# Patient Record
Sex: Male | Born: 1955 | Race: White | Hispanic: No | Marital: Married | Smoking: Never smoker
Health system: Southern US, Community
[De-identification: ages and names within clinical notes are randomized; demographics above are authoritative.]

## PROBLEM LIST (undated history)

## (undated) DIAGNOSIS — F419 Anxiety disorder, unspecified: Secondary | ICD-10-CM

## (undated) DIAGNOSIS — E7800 Pure hypercholesterolemia, unspecified: Secondary | ICD-10-CM

## (undated) DIAGNOSIS — H409 Unspecified glaucoma: Secondary | ICD-10-CM

## (undated) HISTORY — DX: Pure hypercholesterolemia, unspecified: E78.00

## (undated) HISTORY — DX: Anxiety disorder, unspecified: F41.9

## (undated) HISTORY — DX: Unspecified glaucoma: H40.9

---

## 1998-08-28 HISTORY — PX: ROTATOR CUFF REPAIR: SHX139

## 2006-10-30 ENCOUNTER — Encounter: Admission: RE | Admit: 2006-10-30 | Discharge: 2006-10-30 | Payer: Self-pay | Admitting: Orthopedic Surgery
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
## 2017-08-16 ENCOUNTER — Ambulatory Visit (INDEPENDENT_AMBULATORY_CARE_PROVIDER_SITE_OTHER): Payer: 59 | Admitting: Pediatrics

## 2017-08-16 ENCOUNTER — Encounter: Payer: Self-pay | Admitting: Pediatrics

## 2017-08-16 VITALS — BP 120/78 | HR 78 | Temp 97.7°F | Resp 16 | Ht 70.5 in | Wt 217.0 lb

## 2017-08-16 DIAGNOSIS — H1010 Acute atopic conjunctivitis, unspecified eye: Secondary | ICD-10-CM | POA: Diagnosis not present

## 2017-08-16 DIAGNOSIS — J3089 Other allergic rhinitis: Secondary | ICD-10-CM

## 2017-08-16 DIAGNOSIS — Z8669 Personal history of other diseases of the nervous system and sense organs: Secondary | ICD-10-CM | POA: Diagnosis not present

## 2017-08-16 DIAGNOSIS — G4733 Obstructive sleep apnea (adult) (pediatric): Secondary | ICD-10-CM | POA: Diagnosis not present

## 2017-08-16 DIAGNOSIS — Z9989 Dependence on other enabling machines and devices: Secondary | ICD-10-CM | POA: Diagnosis not present

## 2017-08-16 MED ORDER — FLUTICASONE PROPIONATE 50 MCG/ACT NA SUSP: NASAL | 5 refills | Status: DC

## 2017-08-16 NOTE — Progress Notes (Signed)
[address] [place] [address] Dept: [phone]  New Patient Note  Patient ID: Darren BrownerRichard E Nickolson, male    DOB: 03/27/1956  Age: 61 y.o. MRN: [id number] Date of Office Visit: 08/16/2017 Referring provider: No referring provider defined for this encounter.    Chief Complaint: Allergies (eye issues)  HPI Darren BrownerRichard E Runnion presents for an allergy evaluation due to irritated eyes for about 3 or 4 months. His optometrist feels that it could be allergic. He has a history of blepharitis for a year and half and uses artificial tears. He has glaucoma and is on Simbrinza eyedrops. At times he uses Zylet eyedrops 3 times a day. Zylet  contains a steroid and tobramycin . He has had seasonal allergic rhinitis for about 10 years. He has obstructive sleep apnea and uses CPAP. He has never had eczema, asthmatic symptoms or chronic urticaria  Review of Systems  Constitutional: Negative.   HENT:       Seasonal allergic rhinitis for 10 years. Obstructive sleep apnea needing CPAP  Eyes:       Blepharitis for 1-1/2 years irritation of his eyes for the past 3 or 4 months. His eye doctor thinks that it may be allergic. He has glaucoma.  Respiratory: Negative.   Cardiovascular: Negative.   Gastrointestinal:       Gastroesophageal reflux treated with Prilosec daily  Genitourinary: Negative.   Musculoskeletal: Negative.   Skin:       Removal of several basal cell carcinomas  Neurological: Negative.   Endo/Heme/Allergies:       No diabetes or thyroid disease  Psychiatric/Behavioral: Negative.     Outpatient Encounter Medications as of 08/16/2017  Medication Sig  . Loteprednol-Tobramycin (ZYLET) 0.5-0.3 % SUSP 3 (three) times a day.  Marland Kitchen. PARoxetine (PAXIL) 20 MG tablet TAKE 1 TABLET BY MOUTH EVERY MORNING  . SIMBRINZA 1-0.2 % SUSP   . simvastatin (ZOCOR) 20 MG tablet TAKE ONE TABLET (20 MG TOTAL) BY MOUTH EVERY EVENING.  Marland Kitchen. testosterone cypionate (DEPOTESTOSTERONE CYPIONATE) 200 MG/ML injection  Inject into the muscle.  . fluticasone (FLONASE) 50 MCG/ACT nasal spray Use 2 sprays per nostril at night if needed for stuffy nose   No facility-administered encounter medications on file as of 08/16/2017.      Drug Allergies:  Allergies  Allergen Reactions  . Bupropion Rash    Family History: Emerald's family history includes Allergic rhinitis in his father; Emphysema in his father... Sinus problems in his father. Emphysema in his father who was a smoker. There is no family history of asthma, eczema, hives, food allergies.  Social and environmental. There is a cat in the home. He is not exposed to cigarette smoking. He has not smoked cigarettes in the past. 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 His symptoms are not worse at work.  Physical Exam: BP 120/78   Pulse 78   Temp 97.7 F (36.5 C) (Oral)   Resp 16   Ht 5' 10.5" (1.791 m)   Wt 217 lb (98.4 kg)   SpO2 96%   BMI 30.70 kg/m    Physical Exam  Constitutional: He is oriented to person, place, and time. He appears well-developed and well-nourished.  HENT:  Eyes showed mild erythema of the palpebral conjunctiva. Ears normal. Nose mild swelling of nasal turbinates. Pharynx normal except for prominent lateral bands in the posterior pharynx  Neck: Neck supple. No thyromegaly present.  Cardiovascular:  S1 and S2 normal no murmurs  Pulmonary/Chest:  Clear to percussion and auscultation  Abdominal:  Soft. There is no tenderness (no hepatosplenomegaly).  Lymphadenopathy:    He has no cervical adenopathy.  Neurological: He is alert and oriented to person, place, and time.  Skin:  Clear  Psychiatric: He has a normal mood and affect. His behavior is normal. Judgment normal.  Vitals reviewed.   Diagnostics: Allergy skin test showed minimal reactivity to some common indoor molds   Assessment  Assessment and Plan: 1. Seasonal allergic conjunctivitis   2. Other allergic rhinitis   3. Obstructive sleep apnea treated with  continuous positive airway pressure (CPAP)   4. History of glaucoma     Meds ordered this encounter  Medications  . fluticasone (FLONASE) 50 MCG/ACT nasal spray    Sig: Use 2 sprays per nostril at night if needed for stuffy nose    Dispense:  16 g    Refill:  5    Patient Instructions  Environmental control of dust and mold Zyrtec 10 mg-take 1 tablet at night for runny nose or  itchy eyes Fluticasone 2 sprays per nostril at night if needed for stuffy nose Zaditor 0.025%-use one drop 3 times a day to prevent allergies. Zaditor does not contain a steroid Continue on your other medications.  At this time I do not feel that you need Zylet   Return in about 6 weeks (around 09/27/2017).   Thank you for the opportunity to care for this patient.  Please do not hesitate to contact me with questions.  Tonette BihariJ. A. Bardelas, M.D.  [facility] [place] [address] [place], [place] [address] [phone]

## 2017-08-16 NOTE — Patient Instructions (Addendum)
Environmental control of dust and mold Zyrtec 10 mg-take 1 tablet at night for runny nose or  itchy eyes Fluticasone 2 sprays per nostril at night if needed for stuffy nose Zaditor 0.025%-use one drop 3 times a day to prevent allergies. Zaditor does not contain a steroid Continue on your other medications.  At this time I do not feel that you need Zylet

## 2017-09-17 ENCOUNTER — Ambulatory Visit (INDEPENDENT_AMBULATORY_CARE_PROVIDER_SITE_OTHER): Payer: 59 | Admitting: Pediatrics

## 2017-09-17 ENCOUNTER — Encounter: Payer: Self-pay | Admitting: Pediatrics

## 2017-09-17 VITALS — BP 124/76 | HR 87 | Temp 97.9°F | Resp 16

## 2017-09-17 DIAGNOSIS — Z9989 Dependence on other enabling machines and devices: Secondary | ICD-10-CM

## 2017-09-17 DIAGNOSIS — J3089 Other allergic rhinitis: Secondary | ICD-10-CM

## 2017-09-17 DIAGNOSIS — H1010 Acute atopic conjunctivitis, unspecified eye: Secondary | ICD-10-CM

## 2017-09-17 DIAGNOSIS — G4733 Obstructive sleep apnea (adult) (pediatric): Secondary | ICD-10-CM | POA: Diagnosis not present

## 2017-09-17 DIAGNOSIS — Z8669 Personal history of other diseases of the nervous system and sense organs: Secondary | ICD-10-CM | POA: Diagnosis not present

## 2017-09-17 NOTE — Progress Notes (Signed)
  [address] [place] [address] Dept: [phone]  FOLLOW UP NOTE  Patient ID: Darren BrownerRichard E Daleo, male    DOB: 05/29/1956  Age: 62 y.o. MRN: [id number] Date of Office Visit: 09/17/2017  Assessment  Chief Complaint: Allergic Rhinitis   HPI Darren BrownerRichard E Doi presents for follow-up of allergic conjunctivitis and allergic rhinitis. With the use of Zaditor 0.25% one drop twice a eye symptoms are improved. He is not having to use fluticasone nasal spray or  Zyrtec on a daily basis. His other medications are outlined in the chart. He uses artificial tears for blepharitis and is on Simbrinza eyedrops   Drug Allergies:  Allergies  Allergen Reactions  . Bupropion Rash    Physical Exam: BP 124/76   Pulse 87   Temp 97.9 F (36.6 C) (Oral)   Resp 16   SpO2 97%    Physical Exam  Constitutional: He is oriented to person, place, and time. He appears well-developed and well-nourished.  HENT:  Eyes showed mild erythema of the palpebral conjunctiva. Ears normal. Nose normal. Pharynx normal.  Neck: Neck supple.  Cardiovascular:  S1 and S2 normal no murmurs  Pulmonary/Chest:  Clear to percussion and auscultation  Lymphadenopathy:    He has no cervical adenopathy.  Neurological: He is alert and oriented to person, place, and time.  Psychiatric: He has a normal mood and affect. His behavior is normal. Judgment and thought content normal.  Vitals reviewed.   Diagnostics:    Assessment and Plan: 1. Seasonal allergic conjunctivitis   2. Obstructive sleep apnea treated with continuous positive airway pressure (CPAP)   3. Other allergic rhinitis   4. History of glaucoma        Patient Instructions  Zaditor 0.025%-one drop in each eye 3 times a day  a day to prevent allergies Zyrtec 10 mg at night if needed for runny nose or itchy eyes Fluticasone 2 sprays per nostril at night if needed for stuffy nose Call me if you're not doing well on this treatment plan. I do not feel  that allergy injections would be helpful Continue on your other medications   Return in about 1 year (around 09/17/2018).    Thank you for the opportunity to care for this patient.  Please do not hesitate to contact me with questions.  Tonette BihariJ. A. Sherrina Zaugg, M.D.  [facility] [address] [place], [place] [address] [phone]

## 2017-09-17 NOTE — Patient Instructions (Addendum)
Zaditor 0.025%-one drop in each eye 3 times a day  a day to prevent allergies Zyrtec 10 mg at night if needed for runny nose or itchy eyes Fluticasone 2 sprays per nostril at night if needed for stuffy nose Call me if you're not doing well on this treatment plan. I do not feel that allergy injections would be helpful Continue on your other medications

## 2017-12-14 ENCOUNTER — Other Ambulatory Visit: Payer: Self-pay | Admitting: Pediatrics
